# Patient Record
Sex: Male | Born: 1970 | Race: White | Hispanic: No | Marital: Single | Smoking: Current every day smoker
Health system: Southern US, Community
[De-identification: ages and names within clinical notes are randomized; demographics above are authoritative.]

## PROBLEM LIST (undated history)

## (undated) DIAGNOSIS — I10 Essential (primary) hypertension: Secondary | ICD-10-CM

## (undated) DIAGNOSIS — K5792 Diverticulitis of intestine, part unspecified, without perforation or abscess without bleeding: Secondary | ICD-10-CM

---

## 2014-06-19 ENCOUNTER — Emergency Department (HOSPITAL_BASED_OUTPATIENT_CLINIC_OR_DEPARTMENT_OTHER): Payer: Self-pay

## 2014-06-19 ENCOUNTER — Emergency Department (HOSPITAL_BASED_OUTPATIENT_CLINIC_OR_DEPARTMENT_OTHER)
Admission: EM | Admit: 2014-06-19 | Discharge: 2014-06-19 | Disposition: A | Discharge status: Home / Self Care | Payer: Self-pay | Attending: Emergency Medicine | Admitting: Emergency Medicine

## 2014-06-19 ENCOUNTER — Encounter (HOSPITAL_BASED_OUTPATIENT_CLINIC_OR_DEPARTMENT_OTHER): Payer: Self-pay | Admitting: Emergency Medicine

## 2014-06-19 DIAGNOSIS — I10 Essential (primary) hypertension: Secondary | ICD-10-CM | POA: Insufficient documentation

## 2014-06-19 DIAGNOSIS — Z720 Tobacco use: Secondary | ICD-10-CM | POA: Insufficient documentation

## 2014-06-19 DIAGNOSIS — Z79899 Other long term (current) drug therapy: Secondary | ICD-10-CM | POA: Insufficient documentation

## 2014-06-19 DIAGNOSIS — R7989 Other specified abnormal findings of blood chemistry: Secondary | ICD-10-CM

## 2014-06-19 DIAGNOSIS — K5720 Diverticulitis of large intestine with perforation and abscess without bleeding: Secondary | ICD-10-CM

## 2014-06-19 DIAGNOSIS — Z880 Allergy status to penicillin: Secondary | ICD-10-CM | POA: Insufficient documentation

## 2014-06-19 HISTORY — DX: Essential (primary) hypertension: I10

## 2014-06-19 LAB — CBC WITH DIFFERENTIAL/PLATELET
Basophils Absolute: 0 10*3/uL (ref 0.0–0.1)
Basophils Relative: 0 % (ref 0–1)
EOS PCT: 4 % (ref 0–5)
Eosinophils Absolute: 0.3 10*3/uL (ref 0.0–0.7)
HEMATOCRIT: 44.1 % (ref 39.0–52.0)
Hemoglobin: 14.7 g/dL (ref 13.0–17.0)
LYMPHS PCT: 26 % (ref 12–46)
Lymphs Abs: 2.4 10*3/uL (ref 0.7–4.0)
MCH: 30 pg (ref 26.0–34.0)
MCHC: 33.3 g/dL (ref 30.0–36.0)
MCV: 90 fL (ref 78.0–100.0)
MONOS PCT: 8 % (ref 3–12)
Monocytes Absolute: 0.7 10*3/uL (ref 0.1–1.0)
Neutro Abs: 5.6 10*3/uL (ref 1.7–7.7)
Neutrophils Relative %: 62 % (ref 43–77)
Platelets: 307 10*3/uL (ref 150–400)
RBC: 4.9 MIL/uL (ref 4.22–5.81)
RDW: 14 % (ref 11.5–15.5)
WBC: 9 10*3/uL (ref 4.0–10.5)

## 2014-06-19 LAB — URINALYSIS, ROUTINE W REFLEX MICROSCOPIC
BILIRUBIN URINE: NEGATIVE
Glucose, UA: NEGATIVE mg/dL
HGB URINE DIPSTICK: NEGATIVE
KETONES UR: NEGATIVE mg/dL
Leukocytes, UA: NEGATIVE
NITRITE: NEGATIVE
Protein, ur: NEGATIVE mg/dL
Specific Gravity, Urine: 1.02 (ref 1.005–1.030)
Urobilinogen, UA: 1 mg/dL (ref 0.0–1.0)
pH: 5.5 (ref 5.0–8.0)

## 2014-06-19 LAB — BASIC METABOLIC PANEL
Anion gap: 8 (ref 5–15)
BUN: 22 mg/dL (ref 6–23)
CO2: 29 mmol/L (ref 19–32)
CREATININE: 1.67 mg/dL — AB (ref 0.50–1.35)
Calcium: 9.6 mg/dL (ref 8.4–10.5)
Chloride: 101 mEq/L (ref 96–112)
GFR calc non Af Amer: 48 mL/min — ABNORMAL LOW (ref >90)
GFR, EST AFRICAN AMERICAN: 56 mL/min — AB (ref >90)
GLUCOSE: 111 mg/dL — AB (ref 70–99)
Potassium: 4.2 mmol/L (ref 3.5–5.1)
Sodium: 138 mmol/L (ref 135–145)

## 2014-06-19 MED ORDER — SODIUM CHLORIDE 0.9 % IV BOLUS (SEPSIS)
1000.0000 mL | Freq: Once | INTRAVENOUS | Status: AC
  Administered 2014-06-19: 1000 mL via INTRAVENOUS

## 2014-06-19 MED ORDER — HYDROCODONE-ACETAMINOPHEN 5-325 MG PO TABS: 1.0000 | ORAL_TABLET | Freq: Four times a day (QID) | ORAL | Status: DC | PRN

## 2014-06-19 MED ORDER — METRONIDAZOLE 500 MG PO TABS
500.0000 mg | ORAL_TABLET | Freq: Once | ORAL | Status: AC
  Administered 2014-06-19: 500 mg via ORAL
  Filled 2014-06-19: qty 1

## 2014-06-19 MED ORDER — METRONIDAZOLE 500 MG PO TABS: 500.0000 mg | ORAL_TABLET | Freq: Two times a day (BID) | ORAL | Status: DC

## 2014-06-19 MED ORDER — CIPROFLOXACIN HCL 500 MG PO TABS
500.0000 mg | ORAL_TABLET | Freq: Once | ORAL | Status: AC
  Administered 2014-06-19: 500 mg via ORAL
  Filled 2014-06-19: qty 1

## 2014-06-19 MED ORDER — IOHEXOL 300 MG/ML  SOLN
80.0000 mL | Freq: Once | INTRAMUSCULAR | Status: AC | PRN
  Administered 2014-06-19: 80 mL via INTRAVENOUS

## 2014-06-19 MED ORDER — CIPROFLOXACIN HCL 500 MG PO TABS: 500.0000 mg | ORAL_TABLET | Freq: Two times a day (BID) | ORAL | Status: DC

## 2014-06-19 NOTE — ED Notes (Signed)
Pt presents to ED with complaints of middle lower abdominal pain for the past 6 weeks. Pt states he has had more gas than normal and has a lot of pressure with gas. Pt has been taking advil at home for pain.

## 2014-06-19 NOTE — ED Provider Notes (Signed)
CSN: [id number]     Arrival date & time 06/19/14  1100 History   First MD Initiated Contact with Patient 06/19/14 1112     Chief Complaint  Patient presents with  . Abdominal Pain     (Consider location/radiation/quality/duration/timing/severity/associated sxs/prior Treatment) HPI  44 year old male presents with lower abdominal pain. It started over 2 months ago but has been worse over the past 3 weeks. It is in the midline below his umbilicus and just above his pelvis. Pain is a constant 3 out of 10 but occasionally becomes more severe to an 8 out of 10. This lasts a few seconds to a minute. Happens multiple times a day. Initially was only happening with food, now does not happen with food but happens randomly. Any type of movement, laughing, or other touching of his abdomen causes the pain to increase. No nausea, vomiting, constipation, or diarrhea. He feels like he is pretty regular with his bowel movements. No blood in his stools. No prior family history of IBD. No fevers or back pain. No urinary symptoms. No testicular pain. He has not noticed any swelling. The patient has been having increased gas over last couple days and some pain with passing gas.  Past Medical History  Diagnosis Date  . Hypertension    History reviewed. No pertinent past surgical history. No family history on file. History  Substance Use Topics  . Smoking status: Current Every Day Smoker    Types: Cigarettes  . Smokeless tobacco: Not on file  . Alcohol Use: Not on file    Review of Systems  Constitutional: Negative for fever.  Gastrointestinal: Positive for abdominal pain. Negative for nausea, vomiting, diarrhea, constipation and blood in stool.  Genitourinary: Negative for dysuria, hematuria, penile pain and testicular pain.  Musculoskeletal: Negative for back pain.  All other systems reviewed and are negative.     Allergies  Penicillins  Home Medications   Prior to Admission medications    Medication Sig Start Date End Date Taking? Authorizing Provider  lisinopril (PRINIVIL,ZESTRIL) 40 MG tablet Take 40 mg by mouth daily.   Yes Historical Provider, MD  triamterene-hydrochlorothiazide (MAXZIDE) 75-50 MG per tablet Take 1 tablet by mouth daily.   Yes Historical Provider, MD  verapamil (CALAN-SR) 240 MG CR tablet Take 240 mg by mouth at bedtime.   Yes Historical Provider, MD   BP 154/99 mmHg  Pulse 81  Temp(Src) 98 F (36.7 C) (Oral)  Resp 18  Ht  (1.778 m)  Wt 233 lb (105.688 kg)  BMI 33.43 kg/m2  SpO2 96% Physical Exam  Constitutional: He is oriented to person, place, and time. He appears well-developed and well-nourished.  HENT:  Head: Normocephalic and atraumatic.  Right Ear: External ear normal.  Left Ear: External ear normal.  Nose: Nose normal.  Eyes: Right eye exhibits no discharge. Left eye exhibits no discharge.  Neck: Neck supple.  Cardiovascular: Normal rate, regular rhythm, normal heart sounds and intact distal pulses.   Pulmonary/Chest: Effort normal.  Abdominal: Soft. There is tenderness in the suprapubic area. There is no rigidity and no guarding. Hernia confirmed negative in the right inguinal area and confirmed negative in the left inguinal area.  Genitourinary: Right testis shows no mass and no tenderness. Left testis shows no mass and no tenderness. No penile tenderness.  Musculoskeletal: He exhibits no edema.  Neurological: He is alert and oriented to person, place, and time.  Skin: Skin is warm and dry.  Nursing note and vitals reviewed.  ED Course  Procedures (including critical care time) Labs Review Labs Reviewed  BASIC METABOLIC PANEL - Abnormal; Notable for the following:    Glucose, Bld 111 (*)    Creatinine, Ser 1.67 (*)    GFR calc non Af Amer 48 (*)    GFR calc Af Amer 56 (*)    All other components within normal limits  CBC WITH DIFFERENTIAL  URINALYSIS, ROUTINE W REFLEX MICROSCOPIC    Imaging Review Ct Abdomen  Pelvis W Contrast  06/19/2014   CLINICAL DATA:  Suprapubic pain and fever.  EXAM: CT ABDOMEN AND PELVIS WITH CONTRAST  TECHNIQUE: Multidetector CT imaging of the abdomen and pelvis was performed using the standard protocol following bolus administration of intravenous contrast.  CONTRAST:  80mL OMNIPAQUE IOHEXOL 300 MG/ML  SOLN  COMPARISON:  None.  FINDINGS: There is focal diverticulitis of the mid sigmoid colon in the midline of the pelvis. For multiple adjacent diverticula. There is an adjacent 8 mm abscess best seen on image 59 of series 6. No free air or free fluid. The bowel is otherwise normal including the terminal ileum and appendix.  Liver, biliary tree, spleen, pancreas, and left adrenal gland are normal. There is a lobulated low-density lesion in the right adrenal gland most likely a benign adrenal adenoma, measuring 3.5 x 1.2 x 1.2 cm.  There is a 6 mm cyst in the midpole of each kidney and a 15 mm cyst in the anterior aspect of the mid left kidney. No hydronephrosis or solid mass lesions.  The bladder prostate gland are normal. No adenopathy. No significant osseous abnormality.  IMPRESSION: Focal mild sigmoid diverticulitis with an 8 mm adjacent abscess.   Electronically Signed   By: Geanie Cooley M.D.   On: 06/19/2014 13:12     EKG Interpretation None      MDM   Final diagnoses:  Diverticulitis of large intestine with abscess without bleeding  Elevated serum creatinine    Patient's abdominal pain appears to be coming from diverticulitis as seen on a CT scan. Has a small, 8 mm abscess next to this. Patient has a normal white blood cell count, mild to moderate pain, and no fevers or vomiting. He appears well. Discussed the CT results with the surgeon on call, Dr. Johna Sheriff, who recommends oral antibiotics and treatment as if he didn't have an abscess given it is so small. Feels this is appropriate for outpatient treatment. The patient will be given Cipro, Flagyl, and pain medicines. I  discussed the incidental findings on his CT scan. The patient tells me he has had elevated creatinine in the past but does not know the number was told it was due to high blood pressure versus dehydration. He does not appear dehydrated at this time. He is in agreement with outpatient treatment and states he can follow-up with his father's PCP in a week or 2. Discussed strict return precautions.    Audree Camel, MD 06/19/14 [phone]

## 2014-06-19 NOTE — Discharge Instructions (Signed)
Your Creatinine (kidney measurement) is 1.67. This is elevated and needs to be rechecked by a primary doctor    Diverticulitis Diverticulitis is inflammation or infection of small pouches in your colon that form when you have a condition called diverticulosis. The pouches in your colon are called diverticula. Your colon, or large intestine, is where water is absorbed and stool is formed. Complications of diverticulitis can include:  Bleeding.  Severe infection.  Severe pain.  Perforation of your colon.  Obstruction of your colon. CAUSES  Diverticulitis is caused by bacteria. Diverticulitis happens when stool becomes trapped in diverticula. This allows bacteria to grow in the diverticula, which can lead to inflammation and infection. RISK FACTORS People with diverticulosis are at risk for diverticulitis. Eating a diet that does not include enough fiber from fruits and vegetables may make diverticulitis more likely to develop. SYMPTOMS  Symptoms of diverticulitis may include:  Abdominal pain and tenderness. The pain is normally located on the left side of the abdomen, but may occur in other areas.  Fever and chills.  Bloating.  Cramping.  Nausea.  Vomiting.  Constipation.  Diarrhea.  Blood in your stool. DIAGNOSIS  Your health care provider will ask you about your medical history and do a physical exam. You may need to have tests done because many medical conditions can cause the same symptoms as diverticulitis. Tests may include:  Blood tests.  Urine tests.  Imaging tests of the abdomen, including X-rays and CT scans. When your condition is under control, your health care provider may recommend that you have a colonoscopy. A colonoscopy can show how severe your diverticula are and whether something else is causing your symptoms. TREATMENT  Most cases of diverticulitis are mild and can be treated at home. Treatment may include:  Taking over-the-counter pain  medicines.  Following a clear liquid diet.  Taking antibiotic medicines by mouth for 7-10 days. More severe cases may be treated at a hospital. Treatment may include:  Not eating or drinking.  Taking prescription pain medicine.  Receiving antibiotic medicines through an IV tube.  Receiving fluids and nutrition through an IV tube.  Surgery. HOME CARE INSTRUCTIONS   Follow your health care provider's instructions carefully.  Follow a full liquid diet or other diet as directed by your health care provider. After your symptoms improve, your health care provider may tell you to change your diet. He or she may recommend you eat a high-fiber diet. Fruits and vegetables are good sources of fiber. Fiber makes it easier to pass stool.  Take fiber supplements or probiotics as directed by your health care provider.  Only take medicines as directed by your health care provider.  Keep all your follow-up appointments. SEEK MEDICAL CARE IF:   Your pain does not improve.  You have a hard time eating food.  Your bowel movements do not return to normal. SEEK IMMEDIATE MEDICAL CARE IF:   Your pain becomes worse.  Your symptoms do not get better.  Your symptoms suddenly get worse.  You have a fever.  You have repeated vomiting.  You have bloody or black, tarry stools. MAKE SURE YOU:   Understand these instructions.  Will watch your condition.  Will get help right away if you are not doing well or get worse. Document Released: 03/14/2005 Document Revised: 06/09/2013 Document Reviewed: 04/29/2013 [facility] Patient Information 2015 [place], [place]. This information is not intended to replace advice given to you by your health care provider. Make sure you discuss any questions  you have with your health care provider.

## 2014-06-19 NOTE — ED Notes (Signed)
MD back at bedside for reassessment.

## 2014-06-19 NOTE — ED Notes (Signed)
Finishing IV fluids then D/C to home

## 2014-07-18 ENCOUNTER — Emergency Department (HOSPITAL_BASED_OUTPATIENT_CLINIC_OR_DEPARTMENT_OTHER)
Admission: EM | Admit: 2014-07-18 | Discharge: 2014-07-18 | Disposition: A | Discharge status: Home / Self Care | Payer: Self-pay | Attending: Emergency Medicine | Admitting: Emergency Medicine

## 2014-07-18 ENCOUNTER — Emergency Department (HOSPITAL_BASED_OUTPATIENT_CLINIC_OR_DEPARTMENT_OTHER): Payer: Self-pay

## 2014-07-18 ENCOUNTER — Encounter (HOSPITAL_BASED_OUTPATIENT_CLINIC_OR_DEPARTMENT_OTHER): Payer: Self-pay | Admitting: Emergency Medicine

## 2014-07-18 DIAGNOSIS — Z8719 Personal history of other diseases of the digestive system: Secondary | ICD-10-CM | POA: Insufficient documentation

## 2014-07-18 DIAGNOSIS — I10 Essential (primary) hypertension: Secondary | ICD-10-CM | POA: Insufficient documentation

## 2014-07-18 DIAGNOSIS — R1032 Left lower quadrant pain: Secondary | ICD-10-CM | POA: Insufficient documentation

## 2014-07-18 DIAGNOSIS — Z720 Tobacco use: Secondary | ICD-10-CM | POA: Insufficient documentation

## 2014-07-18 DIAGNOSIS — Z880 Allergy status to penicillin: Secondary | ICD-10-CM | POA: Insufficient documentation

## 2014-07-18 DIAGNOSIS — Z79899 Other long term (current) drug therapy: Secondary | ICD-10-CM | POA: Insufficient documentation

## 2014-07-18 HISTORY — DX: Diverticulitis of intestine, part unspecified, without perforation or abscess without bleeding: K57.92

## 2014-07-18 LAB — CBC WITH DIFFERENTIAL/PLATELET
BASOS PCT: 1 % (ref 0–1)
Basophils Absolute: 0.1 10*3/uL (ref 0.0–0.1)
EOS PCT: 4 % (ref 0–5)
Eosinophils Absolute: 0.4 10*3/uL (ref 0.0–0.7)
HCT: 45.5 % (ref 39.0–52.0)
HEMOGLOBIN: 14.9 g/dL (ref 13.0–17.0)
LYMPHS PCT: 37 % (ref 12–46)
Lymphs Abs: 3.6 10*3/uL (ref 0.7–4.0)
MCH: 29.1 pg (ref 26.0–34.0)
MCHC: 32.7 g/dL (ref 30.0–36.0)
MCV: 88.9 fL (ref 78.0–100.0)
MONOS PCT: 8 % (ref 3–12)
Monocytes Absolute: 0.8 10*3/uL (ref 0.1–1.0)
Neutro Abs: 5 10*3/uL (ref 1.7–7.7)
Neutrophils Relative %: 50 % (ref 43–77)
Platelets: 319 10*3/uL (ref 150–400)
RBC: 5.12 MIL/uL (ref 4.22–5.81)
RDW: 14.1 % (ref 11.5–15.5)
WBC: 9.8 10*3/uL (ref 4.0–10.5)

## 2014-07-18 LAB — COMPREHENSIVE METABOLIC PANEL
ALT: 47 U/L (ref 0–53)
ANION GAP: 3 — AB (ref 5–15)
AST: 31 U/L (ref 0–37)
Albumin: 4.1 g/dL (ref 3.5–5.2)
Alkaline Phosphatase: 71 U/L (ref 39–117)
BUN: 17 mg/dL (ref 6–23)
CALCIUM: 9.1 mg/dL (ref 8.4–10.5)
CO2: 28 mmol/L (ref 19–32)
Chloride: 104 mmol/L (ref 96–112)
Creatinine, Ser: 1.42 mg/dL — ABNORMAL HIGH (ref 0.50–1.35)
GFR calc non Af Amer: 59 mL/min — ABNORMAL LOW (ref >90)
GFR, EST AFRICAN AMERICAN: 68 mL/min — AB (ref >90)
GLUCOSE: 94 mg/dL (ref 70–99)
POTASSIUM: 4.3 mmol/L (ref 3.5–5.1)
Sodium: 135 mmol/L (ref 135–145)
TOTAL PROTEIN: 7.5 g/dL (ref 6.0–8.3)
Total Bilirubin: 0.7 mg/dL (ref 0.3–1.2)

## 2014-07-18 LAB — URINALYSIS, ROUTINE W REFLEX MICROSCOPIC
Bilirubin Urine: NEGATIVE
GLUCOSE, UA: NEGATIVE mg/dL
Hgb urine dipstick: NEGATIVE
Ketones, ur: NEGATIVE mg/dL
LEUKOCYTES UA: NEGATIVE
Nitrite: NEGATIVE
Protein, ur: NEGATIVE mg/dL
Specific Gravity, Urine: 1.022 (ref 1.005–1.030)
UROBILINOGEN UA: 1 mg/dL (ref 0.0–1.0)
pH: 5.5 (ref 5.0–8.0)

## 2014-07-18 MED ORDER — IOHEXOL 300 MG/ML  SOLN
50.0000 mL | Freq: Once | INTRAMUSCULAR | Status: AC | PRN
  Administered 2014-07-18: 50 mL via ORAL

## 2014-07-18 MED ORDER — HYDROCODONE-ACETAMINOPHEN 5-325 MG PO TABS: 1.0000 | ORAL_TABLET | Freq: Four times a day (QID) | ORAL | Status: AC | PRN

## 2014-07-18 MED ORDER — CIPROFLOXACIN HCL 500 MG PO TABS: 500.0000 mg | ORAL_TABLET | Freq: Two times a day (BID) | ORAL | Status: AC

## 2014-07-18 MED ORDER — METRONIDAZOLE 500 MG PO TABS: 500.0000 mg | ORAL_TABLET | Freq: Two times a day (BID) | ORAL | Status: AC

## 2014-07-18 MED ORDER — IOHEXOL 300 MG/ML  SOLN
100.0000 mL | Freq: Once | INTRAMUSCULAR | Status: AC | PRN
  Administered 2014-07-18: 100 mL via INTRAVENOUS

## 2014-07-18 NOTE — ED Notes (Signed)
I notified nurse of BP of 167/107, patient states he did not take his BP medication yet today.

## 2014-07-18 NOTE — ED Provider Notes (Signed)
CSN: [id number]     Arrival date & time 07/18/14  1559 History  This chart was scribed for Dylan RoofJohn David Zaidan Keeble III, MD by Gwenyth Oberatherine Macek, ED Scribe. This patient was seen in room MH10/MH10 and the patient's care was started at 5:04 PM.   Chief Complaint  Patient presents with  . Abdominal Pain   Patient is a 44 y.o. male presenting with abdominal pain. The history is provided by the patient. No language interpreter was used.  Abdominal Pain Pain location:  LLQ Pain quality: aching   Pain radiates to:  Does not radiate Pain severity:  Moderate Onset quality:  Gradual Duration:  2 days Timing:  Constant Progression:  Worsening Chronicity:  Recurrent Context: recent illness   Relieved by:  Acetaminophen (Hydrocodone) Worsened by:  Nothing tried Associated symptoms: no diarrhea, no dysuria, no fever, no nausea and no vomiting     HPI Comments: Dylan Bailey is a 44 y.o. male with a history of HTN and diverticulitis who presents to the Emergency Department complaining of gradually worsening lower abdominal pain that started 2 days ago. He tried 2 hydrocodone PTA with mild relief to symptoms. Pt was in the ED 1 month ago and was diagnosed with diverticulitis. He was prescribed Ciprofloxacin with improvement in his symptoms. He states current symptoms are similar to prior episode. Pt has tried following up with GI, but needs a referral. He denies nausea, vomiting, diarrhea and fever as associated symptoms.  Past Medical History  Diagnosis Date  . Hypertension   . Diverticulitis    History reviewed. No pertinent past surgical history. No family history on file. History  Substance Use Topics  . Smoking status: Current Every Day Smoker    Types: Cigarettes  . Smokeless tobacco: Not on file  . Alcohol Use: Not on file    Review of Systems  Constitutional: Negative for fever.  Gastrointestinal: Positive for abdominal pain. Negative for nausea, vomiting and diarrhea.  Genitourinary:  Negative for dysuria.  All other systems reviewed and are negative.   Allergies  Penicillins  Home Medications   Prior to Admission medications   Medication Sig Start Date End Date Taking? Authorizing Provider  ciprofloxacin (CIPRO) 500 MG tablet Take 1 tablet (500 mg total) by mouth 2 (two) times daily. One po bid x 14 days 06/19/14   Audree CamelScott T Goldston, MD  HYDROcodone-acetaminophen (NORCO) 5-325 MG per tablet Take 1 tablet by mouth every 6 (six) hours as needed for severe pain. 06/19/14   Audree CamelScott T Goldston, MD  lisinopril (PRINIVIL,ZESTRIL) 40 MG tablet Take 40 mg by mouth daily.    Historical Provider, MD  metroNIDAZOLE (FLAGYL) 500 MG tablet Take 1 tablet (500 mg total) by mouth 2 (two) times daily. One po bid x 14 days 06/19/14   Audree CamelScott T Goldston, MD  triamterene-hydrochlorothiazide (MAXZIDE) 75-50 MG per tablet Take 1 tablet by mouth daily.    Historical Provider, MD  verapamil (CALAN-SR) 240 MG CR tablet Take 240 mg by mouth at bedtime.    Historical Provider, MD   BP 147/101 mmHg  Pulse 90  Temp(Src) 98.2 F (36.8 C) (Oral)  Resp 18  Ht 5\' 10"  (1.778 m)  Wt 234 lb (106.142 kg)  BMI 33.58 kg/m2  SpO2 99% Physical Exam  Constitutional: He is oriented to person, place, and time. He appears well-developed and well-nourished. No distress.  HENT:  Head: Normocephalic and atraumatic.  Mouth/Throat: Oropharynx is clear and moist.  Eyes: Conjunctivae are normal. Pupils are equal, round, and  reactive to light. No scleral icterus.  Neck: Neck supple.  Cardiovascular: Normal rate, regular rhythm, normal heart sounds and intact distal pulses.   No murmur heard. Pulmonary/Chest: Effort normal and breath sounds normal. No stridor. No respiratory distress. He has no wheezes. He has no rales.  Abdominal: Soft. He exhibits no distension. There is tenderness in the left lower quadrant. There is no rigidity, no rebound and no guarding.  Musculoskeletal: Normal range of motion. He exhibits no edema.   Neurological: He is alert and oriented to person, place, and time.  Skin: Skin is warm and dry. No rash noted.  Psychiatric: He has a normal mood and affect. His behavior is normal.  Nursing note and vitals reviewed.   ED Course  Procedures (including critical care time) DIAGNOSTIC STUDIES: Oxygen Saturation is 99% on RA, normal by my interpretation.    COORDINATION OF CARE: 5:12 PM Discussed treatment plan with pt at bedside and pt agreed to plan.   Labs Review Labs Reviewed  COMPREHENSIVE METABOLIC PANEL - Abnormal; Notable for the following:    Creatinine, Ser 1.42 (*)    GFR calc non Af Amer 59 (*)    GFR calc Af Amer 68 (*)    Anion gap 3 (*)    All other components within normal limits  CBC WITH DIFFERENTIAL/PLATELET  URINALYSIS, ROUTINE W REFLEX MICROSCOPIC    Imaging Review Ct Abdomen Pelvis W Contrast  07/18/2014   CLINICAL DATA:  Lower abdominal pain with onset of symptoms 2 days ago. History of recent diverticulitis 06/19/2014.  EXAM: CT ABDOMEN AND PELVIS WITH CONTRAST  TECHNIQUE: Multidetector CT imaging of the abdomen and pelvis was performed using the standard protocol following bolus administration of intravenous contrast.  CONTRAST:  50mL OMNIPAQUE IOHEXOL 300 MG/ML SOLN, OMNIPAQUE IOHEXOL 300 MG/ML SOLN  COMPARISON:  06/19/2014.  FINDINGS: Musculoskeletal: Gluteal calcific tendinitis on the LEFT. No aggressive osseous lesions. No acute osseous abnormality.  Lung Bases: Dependent atelectasis.  Liver:  Low attenuation of the liver suggesting hepatosteatosis.  Spleen:  Normal.  Gallbladder:  Contracted.  Common bile duct:  Normal.  Pancreas:  Normal.  Adrenal glands: Adrenal glands are unchanged compared to prior. Configuration of the LEFT adrenal gland is similar. RIGHT adrenal adenomas are unchanged in the short term since the prior exam.  Kidneys: Small LEFT renal cysts. Normal enhancement. LEFT ureter appears normal. RIGHT ureter normal.  Stomach:  Normal.   Small bowel:  Normal.  Colon: Normal appendix. Ascending and transverse colon appear normal. Colonic diverticulosis. The diverticulitis in the mid sigmoid colon has improved. There is no abscess. No complicating features. No definite residual stranding compared to the prior study and no mural thickening. The rectum appears within normal limits.  Pelvic Genitourinary:  Prostatomegaly.  Urinary bladder collapsed.  Peritoneum: No free air or free fluid.  Vasculature: Atherosclerosis.  Body Wall: Normal.  IMPRESSION: 1. Resolution of sigmoid diverticulitis. 2. No acute abnormality. 3. Bilateral renal cysts. 4. RIGHT adrenal adenomas.   Electronically Signed   By: Andreas Newport M.D.   On: 07/18/2014 18:49  All radiology studies independently viewed by me.      EKG Interpretation None      MDM   Final diagnoses:  Abdominal pain, left lower quadrant    44 year old male who was recently diagnosed with diverticulitis presents with recurrence of left lower quadrant abdominal pain. He had a CT scan approximately 4 weeks ago which showed diverticulitis with a tiny abscess associated with it. He was  treated with oral antibiotics as an outpatient. He improved significantly, but his symptoms are to recur a few days ago. He is well appearing and nontoxic now. However, since he had an abscess seen on his prior CT, feel that we must repeat CT scan today to assess for complications from diverticulitis.  Scan looks improved and shows no signs of abscess.  I am concerned that pt is in the early stage of recurrence of diverticulitis, so will repeat his cipro and flagyl.  He appears stable for outpatient treatment and will follow-up with GI.  I personally performed the services described in this documentation, which was scribed in my presence. The recorded information has been reviewed and is accurate.     Dylan Roof, MD 07/18/14 [phone]

## 2014-07-18 NOTE — ED Notes (Addendum)
Pt c/o abd pain. Pt has hx of diverticulitis. Pt states he took 2 hydrocodone in last 2 hours.

## 2014-07-18 NOTE — Discharge Instructions (Signed)
Abdominal Pain °Many things can cause abdominal pain. Usually, abdominal pain is not caused by a disease and will improve without treatment. It can often be observed and treated at home. Your health care provider will do a physical exam and possibly order blood tests and X-rays to help determine the seriousness of your pain. However, in many cases, more time must pass before a clear cause of the pain can be found. Before that point, your health care provider may not know if you need more testing or further treatment. °HOME CARE INSTRUCTIONS  °Monitor your abdominal pain for any changes. The following actions may help to alleviate any discomfort you are experiencing: °· Only take over-the-counter or prescription medicines as directed by your health care provider. °· Do not take laxatives unless directed to do so by your health care provider. °· Try a clear liquid diet (broth, tea, or water) as directed by your health care provider. Slowly move to a bland diet as tolerated. °SEEK MEDICAL CARE IF: °· You have unexplained abdominal pain. °· You have abdominal pain associated with nausea or diarrhea. °· You have pain when you urinate or have a bowel movement. °· You experience abdominal pain that wakes you in the night. °· You have abdominal pain that is worsened or improved by eating food. °· You have abdominal pain that is worsened with eating fatty foods. °· You have a fever. °SEEK IMMEDIATE MEDICAL CARE IF:  °· Your pain does not go away within 2 hours. °· You keep throwing up (vomiting). °· Your pain is felt only in portions of the abdomen, such as the right side or the left lower portion of the abdomen. °· You pass bloody or black tarry stools. °MAKE SURE YOU: °· Understand these instructions.   °· Will watch your condition.   °· Will get help right away if you are not doing well or get worse.   °Document Released: 03/14/2005 Document Revised: 06/09/2013 Document Reviewed: 02/11/2013 °ExitCare® Patient Information  ©2015 [employer]. This information is not intended to replace advice given to you by your health care provider. Make sure you discuss any questions you have with your health care provider. ° ° °Emergency Department Resource Guide °1) Find a Doctor and Pay Out of Pocket °Although you won't have to find out who is covered by your insurance plan, it is a good idea to ask around and get recommendations. You will then need to call the office and see if the doctor you have chosen will accept you as a new patient and what types of options they offer for patients who are self-pay. Some doctors offer discounts or will set up payment plans for their patients who do not have insurance, but you will need to ask so you aren't surprised when you get to your appointment. ° °2) Contact Your Local Health Department °Not all health departments have doctors that can see patients for sick visits, but many do, so it is worth a call to see if yours does. If you don't know where your local health department is, you can check in your phone book. The CDC also has a tool to help you locate your state's health department, and many state websites also have listings of all of their local health departments. ° °3) Find a Walk-in Clinic °If your illness is not likely to be very severe or complicated, you may want to try a walk in clinic. These are popping up all over the country in pharmacies, drugstores, and shopping centers. They're   usually staffed by nurse practitioners or physician assistants that have been trained to treat common illnesses and complaints. They're usually fairly quick and inexpensive. However, if you have serious medical issues or chronic medical problems, these are probably not your best option. ° °No Primary Care Doctor: °- Call [facility] at  832-8000 - they can help you locate a primary care doctor that  accepts your insurance, provides certain services, etc. °- Physician Referral Service- 1-800-533-3463 ° °Chronic  Pain Problems: °Organization         Address  Phone   Notes  °[facility]  (336) 297-2271 Patients need to be referred by their primary care doctor.  ° °Medication Assistance: °Organization         Address  Phone   Notes  °[place] [facility] [address] °[place], [place] [address] (336) 641-8030 --Must be a resident of [place] °-- Must have NO insurance coverage whatsoever (no Medicaid/ Medicare, etc.) °-- The pt. MUST have a primary care doctor that directs their care regularly and follows them in the community °  °[facility]  (866) 331-1348   °[employer]  (888) 892-1162   ° °Agencies that provide inexpensive medical care: °Organization         Address  Phone   Notes  °[facility]  (336) 832-8035   °[facility]    (336) 832-7272   °[facility] [address] °[place], [place] [address] (336) 832-4777   °[facility] [address], °[place] (336) 271-4999   °[facility]    (336) 373-0678   °[facility]    (336) 272-1050   °[facility] ° [address], [place] Phone:  (336) 832-4444, Fax:  (336) 832-4440 Hours of Operation:  9 am - 6 pm, M-F.  Also accepts Medicaid/Medicare and self-pay.  °[facility] ° [address], [place] Phone: (336) 832-3150, Fax: (336) 832-3151. Hours of Operation:  8:30 am - 5:30 pm, M-F.  Also accepts Medicaid and self-pay.  °[facility] [place] [address], [place] Phone: (336) 878-6027   °[facility] [address], [place], [place] (336)723-1848, Ext. 123 Mondays & Thursdays: 7-9 AM.  First 15 patients are seen on a first come, first serve basis. °  ° °Medicaid-accepting [place] Providers: ° °Organization         Address  Phone   Notes  °[facility] [address], [place] (336) 641-2100 Also  accepts self-pay patients.  °[facility] [address], [place] ° (336) 856-9996   °[facility] [address], [place] (336) 288-8857   °[facility] [address] [place] [address], [place] (336) 299-7000   °Veita Bland [address], [place]  ° (336) 373-1557 Only accepts [place] Access Medicaid patients after they have their name applied to their card.  ° °Self-Pay (no insurance) in [place]: ° °Organization         Address  Phone   Notes  °Sickle Cell Patients, [facility] [address], [place] (336) 832-1970   °[facility] [address], [place] (336) 832-4400   °[facility] ° [address], [place] (336) 992-4800   °[facility]/Dr. Osei-Bonsu ° [address] [place] [address], [place] or [address], [place] (336) [phone] Phone number for both [place] and [place] locations is the same.  °[facility] [address], [place] (336) 299-0000   °[facility] [address] [place] [address], [place] or [address] (336) 852-7530 °(336) 878-2260   °[facility] [address], [place] (336) 350-1642, phone; (336) 294-5005, fax Sees patients 1st and 3rd Saturday of every month.  Must not qualify for public or private insurance (i.e. Medicaid, Medicare, [place] Health Choice, Veterans' Benefits) • Household income should be no more than 200% of the poverty level •The clinic cannot treat you if you are pregnant or think you are pregnant • Sexually transmitted diseases are not treated at the clinic.  ° ° °Dental Care: °Organization         Address  Phone  Notes  °[facility] [address], [place] (336) 641-6152 Accepts children up to age 21 who are enrolled in Medicaid or [place] Health Choice; pregnant  women with a Medicaid card; and children who have applied for Medicaid or [place] Health Choice, but were declined, whose parents can pay a reduced fee at time of service.  °[facility] [place]  [address], [place] (336) 641-7733 Accepts children up to age 21 who are enrolled in Medicaid or [place] Health Choice; pregnant women with a Medicaid card; and children who have applied for Medicaid or [place] Health Choice, but were declined, whose parents can pay a reduced fee at time of service.  °[facility] ° [address], [place] (336) 641-4533 Patients are seen by appointment only. Walk-ins are not accepted. [facility] will see patients 18 years of age and older. °Monday - Tuesday (8am-5pm) °Most Wednesdays (8:30-5pm) °$30 per visit, cash only  °[facility] ° [address], [place] (336) 641-4533 Patients are seen by appointment only. Walk-ins are not accepted. [facility] will see patients 18 years of age and older. °One Wednesday Evening (Monthly: Volunteer Based).  $30 per visit, cash only  °[facility]  (919) 537-3737 for adults; Children under age 4, call Graduate Pediatric Dentistry at ([phone]) [phone]. Children aged 4-14, please call (919) [phone] to request a pediatric application. ° Dental services are provided in all areas of dental care including fillings, crowns and bridges, complete and partial dentures, implants, gum treatment, root canals, and extractions. Preventive care is also provided. Treatment is provided to both adults and children. °Patients are selected via a lottery and there is often a waiting list. °  °[facility] [address], °[place] ° (336) 763-8833 [email or web address] °  °[facility] [address], [place], [place] (336)723-1848, Ext. 123 Second and Fourth Thursday of each month, opens at 6:30 AM; Clinic ends at 9 AM.  Patients are  seen on a first-come first-served basis, and a limited number are seen during each clinic.  ° °[facility] ° [address], [place], [place] (336) 723-7904   Eligibility Requirements °You must have lived in [place], [place], or [place] counties for at least the last three months. °  You cannot be eligible for state or federal sponsored healthcare insurance, including Veterans Administration, Medicaid, or Medicare. °  You generally cannot be eligible for healthcare insurance through   your employer.  °  How to apply: °Eligibility screenings are held every Tuesday and Wednesday afternoon from 1:00 pm until 4:00 pm. You do not need an appointment for the interview!  °[facility] [address], [place], [place] 336-631-2330   °[facility]  336-342-8273   °[facility]  336-703-3100   °[facility]  336-570-6415   ° °Behavioral Health Resources in the Community: °Intensive Outpatient Programs °Organization         Address  Phone  Notes  °[facility] [address], [place], [place] 336-878-6098   °[facility] [address], [place], [place] 336-832-9800   °[facility] [address], [place], [place] ° 336-882-2125   °[facility] [address],  °[place], [place] 1-800-853-5163 or 336-641-4981   °Substance Abuse Resources °Organization         Address  Phone  Notes  °[facility]  336-882-2125   °[facility]  336-784-9470   °[facility]  336-285-9073   °[facility]  336-845-3988   °Residential & Outpatient Substance Abuse Program  1-800-659-3381   °Psychological Services °Organization         Address  Phone  Notes  ° Health  336- 832-9600   °[facility]  336- 378-7881   °[facility] [address], [place] 1-800-853-5163 or 336-641-4981   ° °Mobile Crisis  Teams °Organization         Address  Phone  Notes  °[facility], Mobile Crisis Care Unit  1-877-626-1772   °Assertive °[facility] ° [address] [place], [place] 336-834-9664   °Sharon DeEsch [address] °[place] 336-554-5454   ° °Self-Help/Support Groups °Organization         Address  Phone             Notes  °[facility] [place] - variety of support groups  336- 373-1402 Call for more information  °Narcotics Anonymous (NA), [facility] [address], °[place]  2 meetings at this location  ° °Residential Treatment Programs °Organization         Address  Phone  Notes  °[facility] [address],    °[place]  1-866-801-8205   °[facility] ° [address], [place], [place] 704-293-8524   °[facility] [address], [place] 336-845-3988 Admissions: 8am-3pm M-F  °[facility] [address]    °[place], [place] 336-841-1104   °[facility] [address], [place], [place] 336-379-7146   °[facility] [address]  °[place], [place] 336-285-9073   °[facility] Intensive Outpatient [address], [place], [place] 336-852-3033   °[facility] [address]  °[place], [place] 1-877-615-2722 or 336-784-9470   °[facility] [address] [place], [place] 336-227-7417 Accepts Medicaid  °[facility] [address]  °  1-800-659-3381 Substance Abuse/Addiction Treatment  ° °[place] Behavioral Health Resources °Organization         Address  Phone  Notes  °[facility]  (888) 581-9988   °Julie Brannon, PhD [address] [place],    (336) 349-5553 or (336) 951-0000   °[facility]   [address] °[place],  (336) 349-4454   °  [facility] [address], [place], [place] (336) 342-8316  Insurance/Medicaid/sponsorship through [facility]  °[facility] [address] [place] [address]                                    [place], [place] (336) 342-8316 Therapy/tele-psych/case  °[facility] [address]  ° [place], [place] (336) 349-2233    °Dr. Arfeen  (336) 349-4544   °[facility] [place]  [employer] [place] [facility] 1) [address], [place] °2) [address], [place] °3)  [address], [place] (336) 349-3220 °(336) 342-7768 ° °(336) 342-8140   °[place] [facility] (336) 342-1394 or (336) 342-3537 (After Hours)    ° ° ° ° °

## 2014-08-31 ENCOUNTER — Other Ambulatory Visit: Payer: Self-pay | Admitting: Gastroenterology

## 2014-08-31 DIAGNOSIS — R1084 Generalized abdominal pain: Secondary | ICD-10-CM

## 2014-08-31 DIAGNOSIS — R509 Fever, unspecified: Secondary | ICD-10-CM

## 2014-08-31 DIAGNOSIS — Z8719 Personal history of other diseases of the digestive system: Secondary | ICD-10-CM

## 2014-09-01 ENCOUNTER — Inpatient Hospital Stay
Admission: RE | Admit: 2014-09-01 | Discharge: 2014-09-01 | Disposition: A | Discharge status: Home / Self Care | Payer: Self-pay | Source: Ambulatory Visit | Attending: Gastroenterology | Admitting: Gastroenterology

## 2014-09-06 ENCOUNTER — Ambulatory Visit
Admission: RE | Admit: 2014-09-06 | Discharge: 2014-09-06 | Disposition: A | Discharge status: Home / Self Care | Payer: Self-pay | Source: Ambulatory Visit | Attending: Gastroenterology | Admitting: Gastroenterology

## 2014-09-06 DIAGNOSIS — Z8719 Personal history of other diseases of the digestive system: Secondary | ICD-10-CM

## 2014-09-06 DIAGNOSIS — R1084 Generalized abdominal pain: Secondary | ICD-10-CM

## 2014-09-06 DIAGNOSIS — R509 Fever, unspecified: Secondary | ICD-10-CM

## 2014-09-06 MED ORDER — IOPAMIDOL (ISOVUE-300) INJECTION 61%
125.0000 mL | Freq: Once | INTRAVENOUS | Status: AC | PRN
  Administered 2014-09-06: 125 mL via INTRAVENOUS

## 2015-10-17 IMAGING — CT CT ABD-PELV W/ CM
3 of 5 series · 12 of 36 positions shown, 18 images · IV contrast (READICAT/WATER & [ID] ISOVUE 300)
Comparison: 07/18/2014

CLINICAL DATA: Followup diverticulitis. Left lower quadrant pain
and pelvic pain.

EXAM:
CT ABDOMEN AND PELVIS WITH CONTRAST
TECHNIQUE: Multidetector CT imaging of the abdomen and pelvis was performed
using the standard protocol following bolus administration of
intravenous contrast.
CONTRAST:  125 cc of Wsovue-FTT

[Series 3: abd/pelvis with · axial · 0.91mm/px · z∈[-411,-41]mm · 7 of 100 slices shown, 12 images]
[im 13/100  soft-tissue]
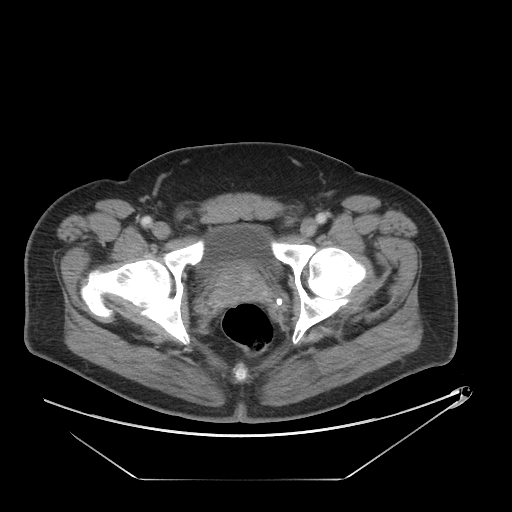
[im 13/100  bone]
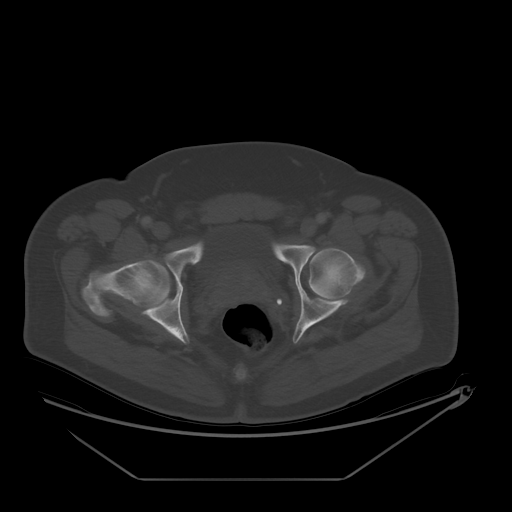
[im 25/100  soft-tissue]
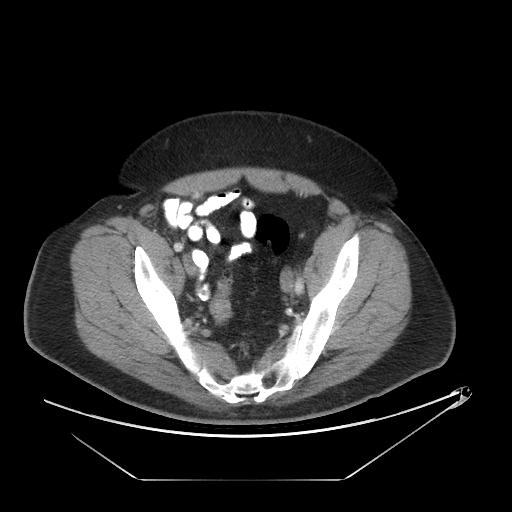
[im 38/100  soft-tissue]
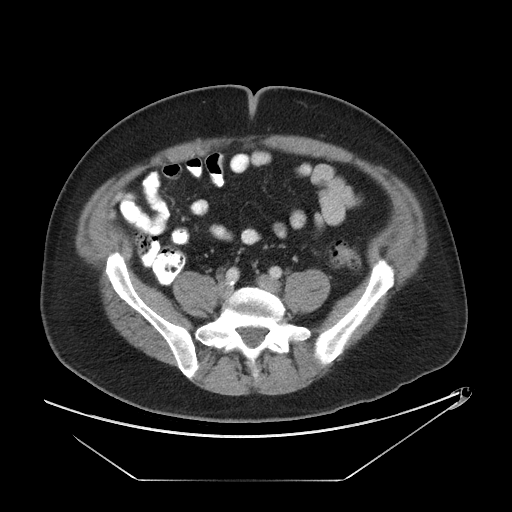
[im 50/100  soft-tissue]
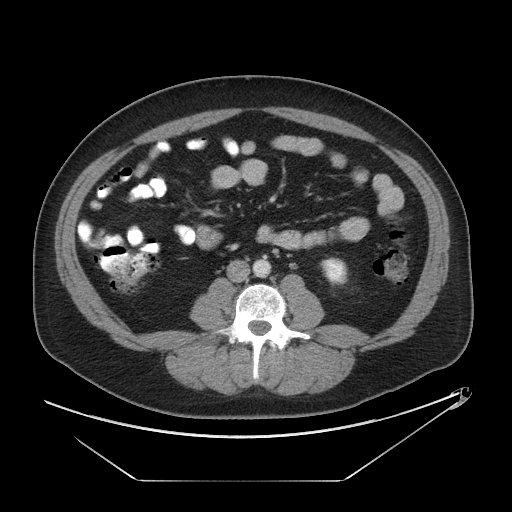
[im 50/100  lung]
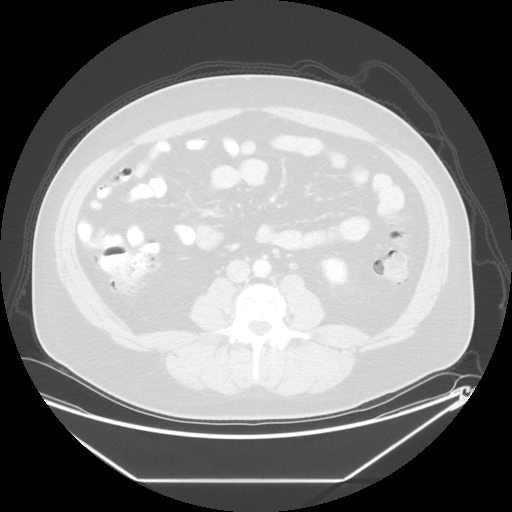
[im 62/100  soft-tissue]
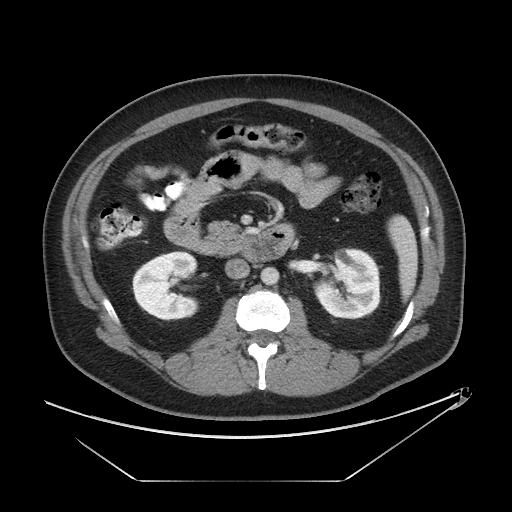
[im 62/100  lung]
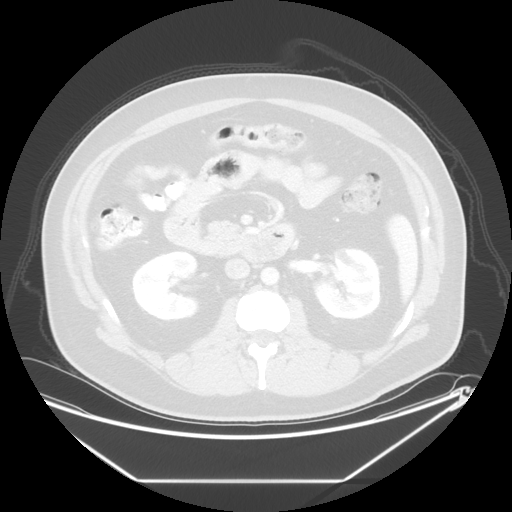
[im 75/100  soft-tissue]
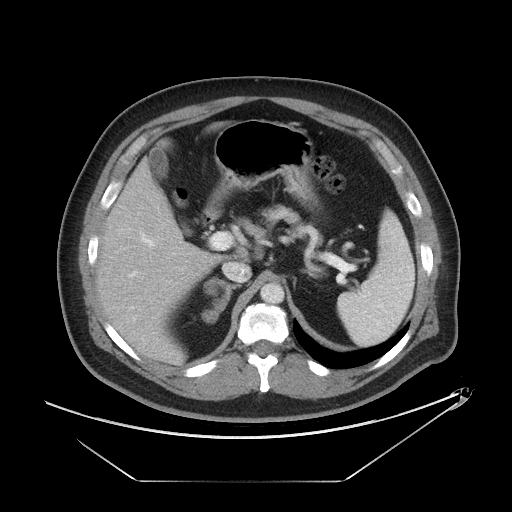
[im 75/100  lung]
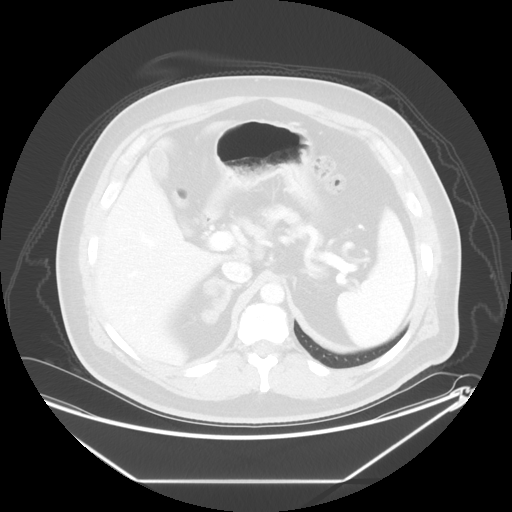
[im 87/100  soft-tissue]
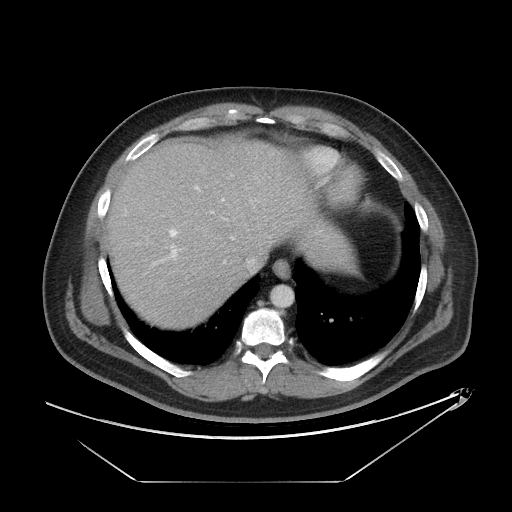
[im 87/100  lung]
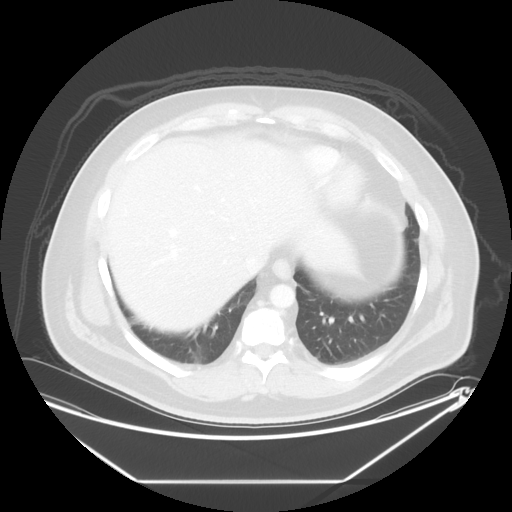

[Series 601: coronal body · coronal · 1.02mm/px · 1 of 131 slices shown, 2 images]
[im 44/131  soft-tissue]
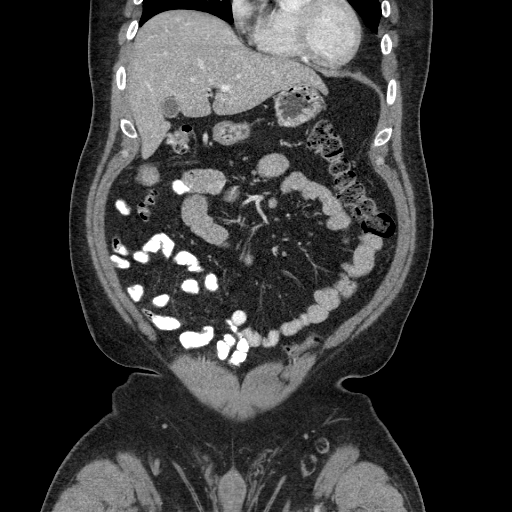
[im 44/131  bone]
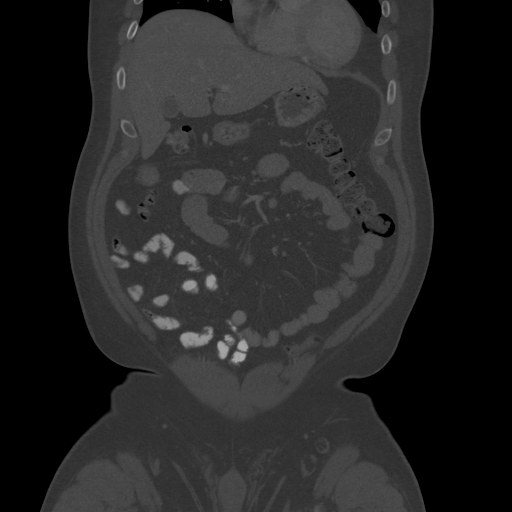

[Series 602: sagittal body · sagittal · 1.02mm/px · 4 of 179 slices shown]
[im 12/179  soft-tissue]
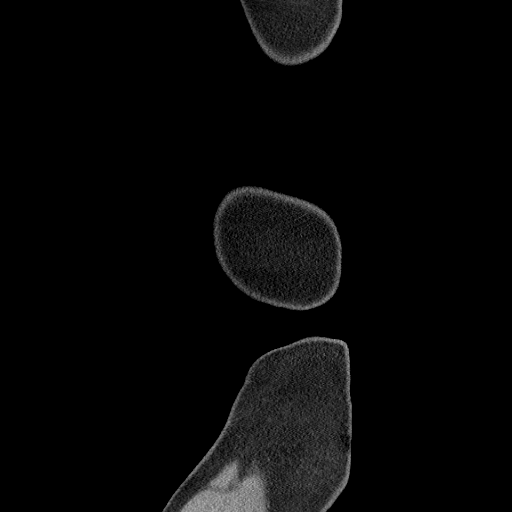
[im 34/179  soft-tissue]
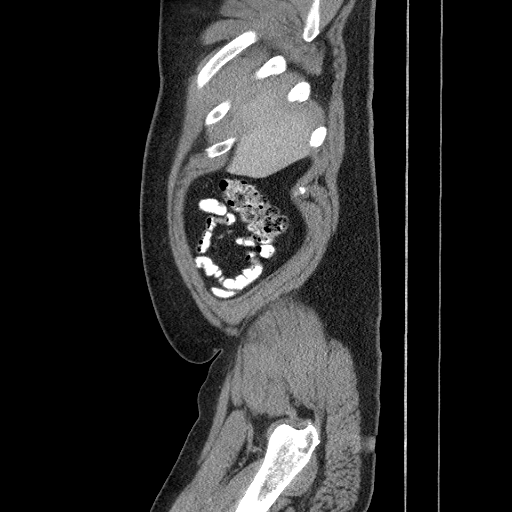
[im 56/179  soft-tissue]
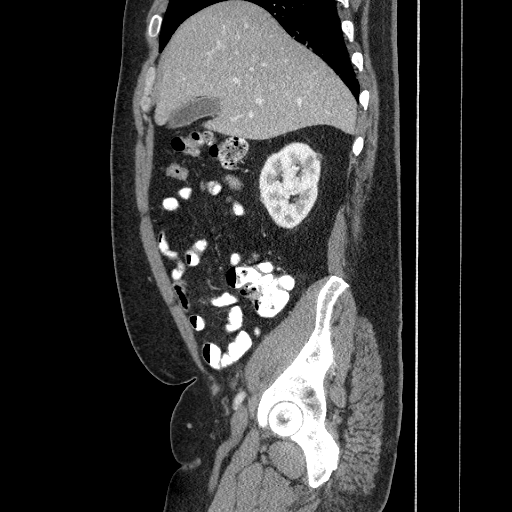
[im 78/179  soft-tissue]
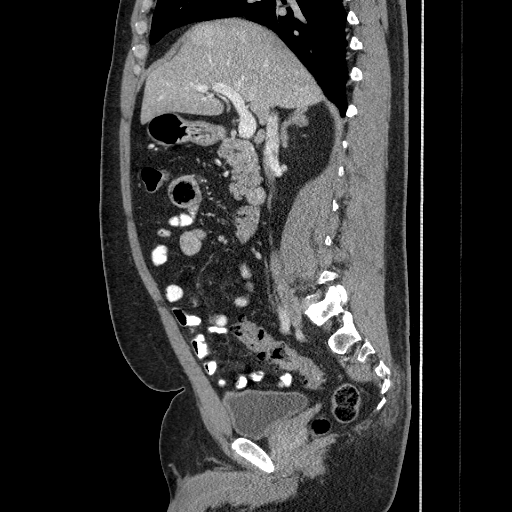

[12 of 36 positions shown; findings below may reference images not displayed]

FINDINGS: Lower chest: The lung bases are clear. No pleural or pericardial
effusion.

Hepatobiliary: No focal liver abnormality identified. The
gallbladder appears normal. No biliary dilatation.

Pancreas: Normal appearance of the pancreas.

Spleen: The spleen is normal.

Adrenals/Urinary Tract: Unremarkable appearance of the left adrenal
gland. Nodules in the right adrenal gland are again noted measuring
1.8 and 1.5 cm, image 25/series 3. These are unchanged and have a
nonspecific appearance. Small hypodensity within the upper pole of
the right kidney measures 6 mm, image 33/series 3. Also in the upper
pole of right kidney is a 6 mm hypodensity, image 34/series 3. 8 cm
cyst is identified arising from the inferior pole of the right
kidney, unchanged from previous exam. 8 mm hypodensity arises from
the mid left kidney, image 40/ series 3. Unchanged from previous
exam. The urinary bladder appears normal.

Stomach/Bowel: The stomach is normal. The small bowel loops have a
normal course and caliber without evidence for bowel obstruction.
The appendix is visualized and appears normal. Normal appearance of
the proximal colon. There has been resolution of previous
inflammatory changes associated with sigmoid diverticulitis.

Vascular/Lymphatic: Calcified atherosclerotic disease involves the
abdominal aorta. No aneurysm. No enlarged retroperitoneal or
mesenteric adenopathy. No enlarged pelvic or inguinal lymph nodes.

Reproductive: Prostate gland and seminal vesicles are unremarkable.

Other: No free fluid or fluid collections identified.

Musculoskeletal: Review of the visualized osseous structures is
negative for aggressive lytic or sclerotic bone lesion.
IMPRESSION: 1. Interval resolution of sigmoid diverticulitis. No complications
identified.
2. No change in indeterminate right adrenal nodules. Imaging
features are nonspecific. Consider further evaluation with adrenal
protocol MRI or noncontrast CT of the upper abdomen for more
definitive assessment.

## 2019-09-17 ENCOUNTER — Inpatient Hospital Stay

## 2019-09-17 ENCOUNTER — Telehealth

## 2019-09-17 NOTE — Telephone Encounter
PDL Call to Practice    Reason for Call: Patient is calling to schedule a same day appointment due to chest pains he has been having and ended up in ER last night. Patient will be a cash pay patient.  Thank you  MD: No PCP    Appointment Related?  [x]  Yes  []  No     If yes;  Date: 09/17/19  Time:    Call warm transferred to PDL: [x]  Yes  []  No    Call Received by Practice Representative: 

## 2019-09-18 ENCOUNTER — Ambulatory Visit

## 2019-09-18 DIAGNOSIS — R7303 Prediabetes: Secondary | ICD-10-CM

## 2019-09-18 DIAGNOSIS — E782 Mixed hyperlipidemia: Secondary | ICD-10-CM

## 2019-09-18 DIAGNOSIS — R079 Chest pain, unspecified: Secondary | ICD-10-CM

## 2019-09-18 MED ORDER — AMLODIPINE BESYLATE 10 MG PO TABS
10 mg | ORAL_TABLET | Freq: Every day | ORAL | 3 refills | Status: AC
Start: 2019-09-18 — End: ?

## 2019-09-18 MED ORDER — ATORVASTATIN CALCIUM 40 MG PO TABS
40 mg | ORAL_TABLET | Freq: Every day | ORAL | 3 refills | Status: AC
Start: 2019-09-18 — End: ?

## 2019-09-18 NOTE — Progress Notes
[facility] New Patient Visit    Consulting Cardiologist: Celene Skeen  Date/Time: 09/18/2019  Chief Complaint/Reason for Visit: Chest pain    Dear Colleagues,  I had the pleasure of seeing Genaro Melhado today as a new patient in the [facility]. As you know, Bronte Dewater is a 49 y.o. male with a PMH significant for HTN, HLD, current tobacco use (1/2 ppd), premature FHx of CAD, and obesity who presents for evaluation of chest pain. Briefly, W[place]rop reports 4-5 days of new-onset, band-like chest pain across his sternum. It appears to be fairly random and not related to exertion. He presented to the [facility] ED on 09/16/19 where an ECG, troponins, and CXR were unremarkable. Today, he states that the pain continues to wax and wane, but is not accelerating. He denies any dyspnea, palpitations, pre-syncope, syncope, orthopnea, PND, or lower extremity swelling. His home BP's are in the 130-150's.    Father-MI at 4  Grandfather- died of MI in 61's     Medications:  Outpatient Medications Prior to Visit   Medication Sig Dispense Refill   ? Acetaminophen (TYLENOL) 325 MG CAPS Take by mouth.     ? amLODIPine 5 mg tablet Take 5 mg by mouth daily.     ? hydroCHLOROthiazide 25 mg tablet Take 25 mg by mouth.     ? lisinopril 40 mg tablet Take 40 mg by mouth daily.     ? TRIAMTERENE-HCTZ PO Take 37.5 mg by mouth 37.5/25 .     ? verapamil 240 mg ER tablet Take 240 mg by mouth at bedtime.     ? simvastatin 10 mg tablet Take 10 mg by mouth at bedtime.       No facility-administered medications prior to visit.        Physical Exam  Vitals:    09/18/19 1504   BP: 151/109   BP Location: Left arm   Patient Position: Sitting   Cuff Size: Large   Pulse: 98   Resp: 18   Temp: 36.7 ?C (98 ?F)   TempSrc: Skin   SpO2: 98%   Weight: 206 lb (93.4 kg)   Height: 5' 9'' (1.753 m)     Gen: Alert oriented, NAD  Neck: No carotid bruits, no JVD  Lungs: CTAB, no rales or crackles appreciated  CV: RRR, nl S1, S2, no murmurs, no rubs or gallops, no S3 or S4 appreciated  Abd: NT x 4, no masses or splenomegaly noted, normal liver  Extr: distal pulses intact, no peripheral edema, no cyanosis or clubbing  Neuro:  Non focal neurologic examination    Laboratory Data  Reviewed by me Lab Result Comment   [x]   CBC 3/31: WBC 14.15, Hb 15.6 Leukocytosis, normal Hb   [x]   Basic Metabolic Panel 3/31: Cr 1.4 Mildly elevated Cr   []   Lipid Panel No results found for: CHOL, CHOLHDL, CHOLDLCAL, CHOLDLQ, TRIGLY    []   HbA1c No results found for: HGBA1C    []   BNP No results found for: BNP    [x]   Other   3/31: Trop neg  Negative troponin     Diagnostic Data  ECG (09/16/19, OSH):  On my independent review and interpretation on 09/18/19 of the above ECG:  NSR, normal intervals, no ischemic changes     Impression/Recommendations  Pele Stumpo is a 49 y.o. year-old male with a PMH significant for HTN, HLD, current tobacco use (1/2 ppd), premature FHx of CAD, and obesity  who presents for evaluation of chest pain. The active problem list includes:     #Chest pain: Atypical but with multiple CVD risk factors. Non-accelerating.  --CCTA to rule out obstructive CAD; BMP ordered for renal fxn  --ED precautions given  --Counseled extensively on implementing a heart-healthy diet and standard cardiovascular exercise regimen for risk factor control and primary prevention of CAD  --Counseled extensively on smoking cessation; he has stopped since chest pain onset    #HTN: Suboptimal control  --Increase Amlodipine to 10 mg daily  --Continue Lisinopril 40 mg daily  --Continue Triamterene-hydrochlorothiazide 37.5-25 mg daily  --Home BP log    #HLD:   --Stop Simvastatin and start Atorvastatin 40 mg at bedtime  --Check lipid panel  --Check HbA1c    The above plan was discussed with Skeet Simmer. All questions were answered satisfactorily and Aseem Sessums is in agreement with this recommended plan of care.     I will follow-up with the patient following his CCTA. Please do not hesitate to contact me with any questions or concerns.    Sincerely,    Samul Dada, MD, [facility], [facility]  Assistant Clinical Professor  Interventional Cardiology ~ Advanced Heart Failure  Division of Cardiology  [facility] [place] [place]      The MDM for today's encounter was Moderate Complexity  Number/Complexity of Problems:   --1 or more chronic with exacerbation, progression, or side effects of treatment  --1 acute undiagnosed/uncertain prognosis   Amount/Complexity of Data Analyzed:  --Reviewed/ordered 3 or more of the following: Labs, imaging tests, and/or diagnostic tests; prior external notes and incorporated into the patient assessment/plan   --Independent interpretation of test performed by another physician   Moderate Risk of Complications and/or Morbidity Associated with Patient Management:   --Prescription drug management

## 2019-09-18 NOTE — ED Notes
Patient stated he has some family matters to attend to and is leaving; has family members with him and said will probably come back later. Unable to convince patient to stay. Patient left with family member.

## 2021-02-01 ENCOUNTER — Telehealth: Payer: PRIVATE HEALTH INSURANCE

## 2021-02-01 NOTE — Telephone Encounter
PDL Call to Practice    Reason for Call:Financial Clearance Dept calling about appt that was scheduled this morning for patient, has no referral   Please assist     Appointment Related?  []  Yes  [x]  No     If yes;  Date:  Time:    Call warm transferred to PDL: [x]  Yes  []  No    Call Received by Practice Representative: 

## 2021-02-03 ENCOUNTER — Non-Acute Institutional Stay: Payer: PRIVATE HEALTH INSURANCE | Attending: Nephrology

## 2021-02-03 ENCOUNTER — Telehealth: Payer: PRIVATE HEALTH INSURANCE

## 2021-02-03 NOTE — Telephone Encounter
[facility] Verification - Insurance Verification/Authorization     Regarding: Insurance Verification [ ]     Authorization [ xxx]   Missing Documentation [ ]                       Date of Service: 02/06/2021    Provider:       Reason: appropriate reasoning   [xxx ] Patient Not Financially Cleared   [ ]  No active Insurance Coverage   [ ]  Authorization submitted - Pending Approval (Turnaround Time  [ ]  Authorization Denied (Please see below denial Reason)   [ ]  Patient has been contacted   [ ]  Patient would like to reschedule - Please contact Patient   [ ]  Patient has been advised of ABN form and agrees to sign - Auth Pending   [ ]  Patient to proceed as Self-Pay - Please contact Patient   [ ]  Procedure not indicated in Appt Notes (Please advise)      Other (explain): PENDING  08/19/202  DOS: 02/06/2021     CALLED [facility] @ [phone] [facility] W/ [place] STATES THERE IS NO AUTH ON FILE FOR PATIENT AT THIS TIME TO BE SEEN AT [facility] AND PATIENT WILL NEED TO GET AN AUTH FROM THERE PCP SINE PATIENT IS NEW AND THERE ARE NO PROG NOTES TO SUBMIT FOR WILL CONTINUE TO F/U ON 02/06/2021 dialed PT to advise of auth info @ [phone] @ 02:35pm  no answer, left VM. Telephone encounter sent to clinic. [place] PHONE#: [phone] EXT [phone]

## 2021-02-06 ENCOUNTER — Telehealth: Payer: PRIVATE HEALTH INSURANCE

## 2021-02-06 ENCOUNTER — Non-Acute Institutional Stay: Payer: PRIVATE HEALTH INSURANCE

## 2021-02-06 NOTE — Telephone Encounter
[facility] Verification - Insurance Verification/Authorization     Regarding: Insurance Verification [ ]     Authorization XXX ]   Missing Documentation [ ]                       Date of Service: 02/06/2021    Provider:       Reason: appropriate reasoning   02/08/2021 ] Patient Not Financially Cleared   [ ]  No active Insurance Coverage   [ ]  Authorization submitted - Pending Approval (Turnaround Time  [ ]  Authorization Denied (Please see below denial Reason)   Loraine Leriche ] Patient has been contacted   [ ]  Patient would like to reschedule - Please contact Patient   [ ]  Patient has been advised of ABN form and agrees to sign - Auth Pending   [ ]  Patient to proceed as Self-Pay - Please contact Patient   [ ]  Procedure not indicated in Appt Notes (Please advise)      Other (explain): PENDING  02/06/2021  DOS: 02/06/2021     [facility] @ [phone] [facility] W/ [place] STATES THERE IS STILL NO AUTH ON FILE FOR PATIENT AT THIS TIME TO BE SEEN AT [facility] AND PATIENT WILL NEED TO GET AN AUTH FROM THERE PCP SINE PATIENT IS NEW AND THERE ARE NO PROG NOTES TO SUBMIT FOR WILL CONTINUE TO F/U ON 08/23/2022dialed PT to advise of auth info @ [phone] @ 09:49AM   no answer, left VM. Telephone encounter sent to clinic. [place] PHONE#: [phone] EXT [phone]
